# Patient Record
Sex: Male | Born: 1995 | Race: White | Hispanic: Yes | Marital: Single | Smoking: Never smoker
Health system: Southern US, Community
[De-identification: ages and names within clinical notes are randomized; demographics above are authoritative.]

## PROBLEM LIST (undated history)

## (undated) ENCOUNTER — Emergency Department (HOSPITAL_BASED_OUTPATIENT_CLINIC_OR_DEPARTMENT_OTHER): Admission: EM | Payer: Medicaid Other | Source: Home / Self Care

## (undated) ENCOUNTER — Emergency Department (HOSPITAL_COMMUNITY): Payer: No Typology Code available for payment source | Source: Home / Self Care

## (undated) DIAGNOSIS — J302 Other seasonal allergic rhinitis: Secondary | ICD-10-CM

## (undated) HISTORY — PX: TONSILLECTOMY: SUR1361

## (undated) HISTORY — PX: ANKLE SURGERY: SHX546

## (undated) HISTORY — PX: FOOT SURGERY: SHX648

## (undated) HISTORY — PX: ADENOIDECTOMY: SUR15

---
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
## 2017-05-22 ENCOUNTER — Other Ambulatory Visit: Payer: Self-pay

## 2017-05-22 ENCOUNTER — Emergency Department (HOSPITAL_BASED_OUTPATIENT_CLINIC_OR_DEPARTMENT_OTHER): Payer: 59

## 2017-05-22 ENCOUNTER — Emergency Department (HOSPITAL_BASED_OUTPATIENT_CLINIC_OR_DEPARTMENT_OTHER)
Admission: EM | Admit: 2017-05-22 | Discharge: 2017-05-22 | Disposition: A | Discharge status: Home / Self Care | Payer: 59 | Attending: Emergency Medicine | Admitting: Emergency Medicine

## 2017-05-22 ENCOUNTER — Encounter (HOSPITAL_BASED_OUTPATIENT_CLINIC_OR_DEPARTMENT_OTHER): Payer: Self-pay | Admitting: Emergency Medicine

## 2017-05-22 DIAGNOSIS — Y9375 Activity, martial arts: Secondary | ICD-10-CM | POA: Diagnosis not present

## 2017-05-22 DIAGNOSIS — Y999 Unspecified external cause status: Secondary | ICD-10-CM | POA: Diagnosis not present

## 2017-05-22 DIAGNOSIS — S63617A Unspecified sprain of left little finger, initial encounter: Secondary | ICD-10-CM | POA: Insufficient documentation

## 2017-05-22 DIAGNOSIS — X501XXA Overexertion from prolonged static or awkward postures, initial encounter: Secondary | ICD-10-CM

## 2017-05-22 DIAGNOSIS — Z79899 Other long term (current) drug therapy: Secondary | ICD-10-CM | POA: Diagnosis not present

## 2017-05-22 DIAGNOSIS — Y929 Unspecified place or not applicable: Secondary | ICD-10-CM | POA: Insufficient documentation

## 2017-05-22 DIAGNOSIS — W231XXA Caught, crushed, jammed, or pinched between stationary objects, initial encounter: Secondary | ICD-10-CM | POA: Insufficient documentation

## 2017-05-22 DIAGNOSIS — J45909 Unspecified asthma, uncomplicated: Secondary | ICD-10-CM | POA: Diagnosis not present

## 2017-05-22 DIAGNOSIS — Z791 Long term (current) use of non-steroidal anti-inflammatories (NSAID): Secondary | ICD-10-CM | POA: Insufficient documentation

## 2017-05-22 DIAGNOSIS — S6992XA Unspecified injury of left wrist, hand and finger(s), initial encounter: Secondary | ICD-10-CM | POA: Diagnosis present

## 2017-05-22 NOTE — ED Triage Notes (Signed)
L index finger injury on Saturday while doing martial arts. Pain persists.

## 2017-05-22 NOTE — Discharge Instructions (Signed)
Your x-ray shows no signs of fracture.  There is likely a sprain.  Wear the splint for comfort and to decrease the mobility.  Icing the affected area.  Motrin and Tylenol.  Have given you follow-up with a hand doctor if your symptoms are not improving.  Return to the ED V develop any worsening symptoms.

## 2017-05-22 NOTE — ED Notes (Signed)
Pt verbalizes understanding of d/c instructions and denies any further needs at this time. 

## 2017-05-23 NOTE — ED Provider Notes (Signed)
[facility] EMERGENCY DEPARTMENT Provider Note   CSN: [id number] Arrival date & time: 05/22/17  1843     History   Chief Complaint Chief Complaint  Patient presents with  . Finger Injury    HPI Cory Oliver is a 21 y.o. male.  HPI 21 year old presents to the ED with no pertinent past medical history for evaluation of left index finger pain.  Patient states the pain has been ongoing for the past 3 days.  States that he was doing martial arts this past Saturday when he jammed his left index finger.  Patient is tried icing it with some relief.  Has tried no over-the-counter analgesics for the pain.  States the pain persist.  Denies any associated paresthesias, weakness, wound.  Movement makes the pain worse.  Nothing makes the pain better. Past Medical History:  Diagnosis Date  . Asthma   . Seasonal allergies     There are no active problems to display for this patient.   Past Surgical History:  Procedure Laterality Date  . ADENOIDECTOMY    . ANKLE SURGERY    . FOOT SURGERY    . TONSILLECTOMY         Home Medications    Prior to Admission medications   Medication Sig Start Date End Date Taking? Authorizing Provider  albuterol (PROVENTIL HFA;VENTOLIN HFA) 108 (90 BASE) MCG/ACT inhaler Inhale 2 puffs into the lungs every 6 (six) hours as needed. For shortness of breath and wheezing     [provider]  ibuprofen (ADVIL,MOTRIN) 400 MG tablet Take 400 mg by mouth every 6 (six) hours as needed. For pain     [provider]  ibuprofen (ADVIL,MOTRIN) 600 MG tablet Take 1 tablet (600 mg total) by mouth every 6 (six) hours as needed. 08/25/13   Elpidio AnisUpstill, Shari, PA-C  mometasone (NASONEX) 50 MCG/ACT nasal spray Place 1 spray into the nose 2 (two) times daily.      [provider]  naproxen (NAPROSYN) 500 MG tablet Take 1 tablet (500 mg total) by mouth 2 (two) times daily. 11/17/13   Vanetta MuldersZackowski, Scott, MD  naproxen sodium (ANAPROX) 220 MG tablet Take  1 tablet (220 mg total) by mouth 2 (two) times daily with a meal. 06/25/11   Felisa Bonieronnor, Michael D, MD    Family History No family history on file.  Social History Social History   Tobacco Use  . Smoking status: Never Smoker  . Smokeless tobacco: Never Used  Substance Use Topics  . Alcohol use: No  . Drug use: Not on file     Allergies   Patient has no known allergies.   Review of Systems Review of Systems  Musculoskeletal: Positive for arthralgias, joint swelling and myalgias.  Skin: Negative for color change and wound.  Neurological: Negative for weakness and numbness.     Physical Exam Updated Vital Signs BP 126/73 (BP Location: Right Arm)   Pulse 71   Temp 99.3 F (37.4 C) (Oral)   Resp 18   Wt 74.8 kg (165 lb)   SpO2 100%   BMI 25.84 kg/m   Physical Exam  Constitutional: He appears well-developed and well-nourished. No distress.  HENT:  Head: Normocephalic and atraumatic.  Eyes: Right eye exhibits no discharge. Left eye exhibits no discharge. No scleral icterus.  Neck: Normal range of motion.  Pulmonary/Chest: No respiratory distress.  Musculoskeletal: Normal range of motion.  Patient with full range of motion of the left index finger over the DIP PIP and MCP  joint.  Minimal edema noted.  No associated ecchymosis, erythema, warmth of the joints.  Brisk cap refill.  5 out of 5 strength with flexion and extension of the PIP and DIP.  Radial pulses 2+ bilaterally.  Sensation intact.  Neurological: He is alert.  Skin: No pallor.  Psychiatric: His behavior is normal. Judgment and thought content normal.  Nursing note and vitals reviewed.    ED Treatments / Results  Labs (all labs ordered are listed, but only abnormal results are displayed) Labs Reviewed - No data to display  EKG  EKG Interpretation None       Radiology Dg Finger Little Left  Result Date: 05/22/2017 CLINICAL DATA:  Twisting injury of the left little finger. EXAM: LEFT LITTLE FINGER  2+V COMPARISON:  None. FINDINGS: There is no evidence of fracture or dislocation. There is no evidence of arthropathy or other focal bone abnormality. Soft tissues are unremarkable. IMPRESSION: Negative. Electronically Signed   By: Ted Mcalpineobrinka  Dimitrova M.D.   On: 05/22/2017 19:51    Procedures Procedures (including critical care time)  Medications Ordered in ED Medications - No data to display   Initial Impression / Assessment and Plan / ED Course  I have reviewed the triage vital signs and the nursing notes.  Pertinent labs & imaging results that were available during my care of the patient were reviewed by me and considered in my medical decision making (see chart for details).     Patient X-Ray negative for obvious fracture or dislocation.  Patient is neurovascularly intact.  Pain managed in ED. Pt advised to follow up with orthopedics if symptoms persist for possibility of missed fracture diagnosis. Patient given brace while in ED, conservative therapy recommended and discussed. Patient will be dc home & is agreeable with above plan.   Final Clinical Impressions(s) / ED Diagnoses   Final diagnoses:  Sprain of left little finger, unspecified site of finger, initial encounter    ED Discharge Orders    None       Wallace KellerLeaphart, Kenneth T, PA-C 05/23/17 1335    Long, Arlyss RepressJoshua G, MD 05/24/17 [phone]

## 2018-08-01 IMAGING — CR DG FINGER LITTLE 2+V*L*
3 series · 3 of 3 positions shown · non-contrast
Comparison: None.

CLINICAL DATA: Twisting injury of the left little finger.

EXAM:
LEFT LITTLE FINGER 2+V

[x finger pa left]
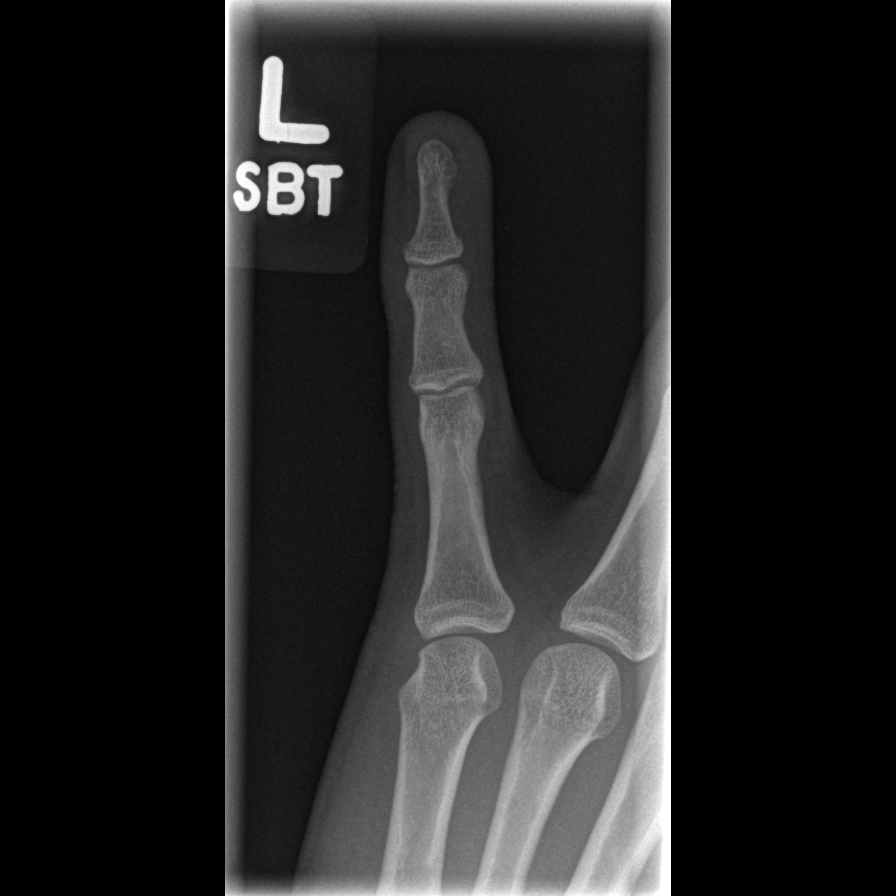

[x finger obl. left]
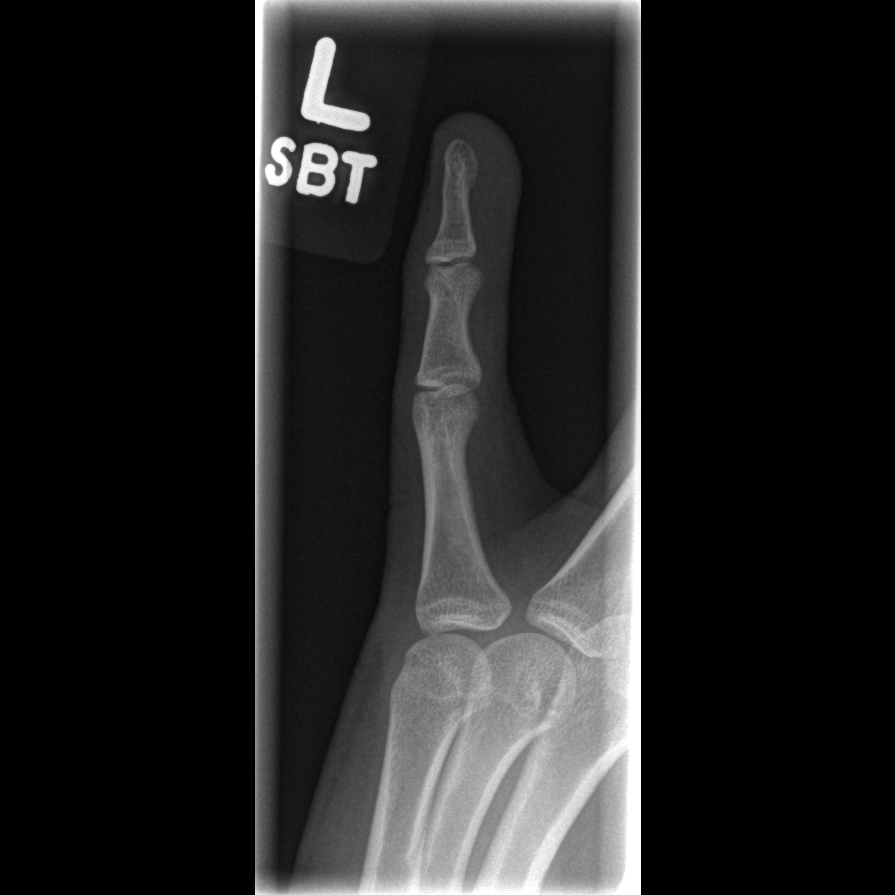

[x finger lateral left]
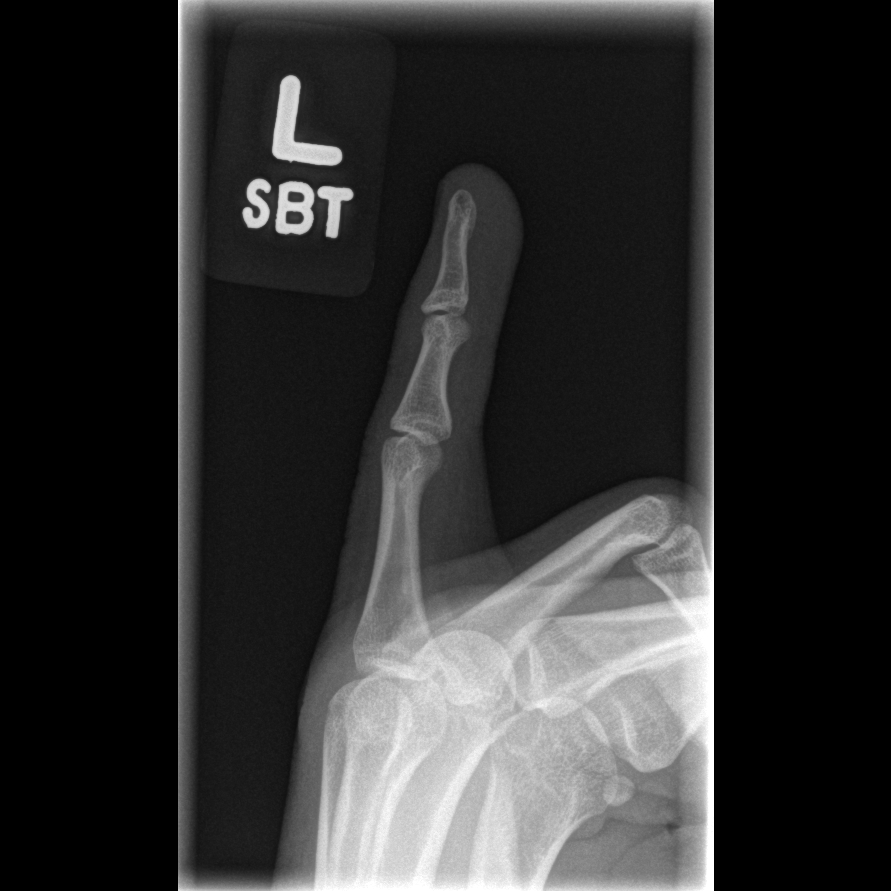

[3 of 3 positions shown; findings below may reference images not displayed]

FINDINGS: There is no evidence of fracture or dislocation. There is no
evidence of arthropathy or other focal bone abnormality. Soft
tissues are unremarkable.
IMPRESSION: Negative.

## 2020-03-17 ENCOUNTER — Encounter (HOSPITAL_COMMUNITY): Payer: Self-pay

## 2020-03-17 ENCOUNTER — Other Ambulatory Visit: Payer: Self-pay

## 2020-03-17 ENCOUNTER — Emergency Department (HOSPITAL_COMMUNITY)
Admission: EM | Admit: 2020-03-17 | Discharge: 2020-03-18 | Disposition: A | Discharge status: Home / Self Care | Payer: HRSA Program | Attending: Emergency Medicine | Admitting: Emergency Medicine

## 2020-03-17 DIAGNOSIS — R109 Unspecified abdominal pain: Secondary | ICD-10-CM | POA: Insufficient documentation

## 2020-03-17 DIAGNOSIS — M7918 Myalgia, other site: Secondary | ICD-10-CM | POA: Insufficient documentation

## 2020-03-17 DIAGNOSIS — Z79899 Other long term (current) drug therapy: Secondary | ICD-10-CM | POA: Insufficient documentation

## 2020-03-17 DIAGNOSIS — U071 COVID-19: Secondary | ICD-10-CM | POA: Insufficient documentation

## 2020-03-17 DIAGNOSIS — J45909 Unspecified asthma, uncomplicated: Secondary | ICD-10-CM | POA: Insufficient documentation

## 2020-03-17 DIAGNOSIS — R509 Fever, unspecified: Secondary | ICD-10-CM | POA: Diagnosis present

## 2020-03-17 LAB — URINALYSIS, ROUTINE W REFLEX MICROSCOPIC
Bilirubin Urine: NEGATIVE
Glucose, UA: NEGATIVE mg/dL
Hgb urine dipstick: NEGATIVE
Ketones, ur: 80 mg/dL — AB
Leukocytes,Ua: NEGATIVE
Nitrite: NEGATIVE
Protein, ur: 100 mg/dL — AB
Specific Gravity, Urine: 1.031 — ABNORMAL HIGH (ref 1.005–1.030)
pH: 5 (ref 5.0–8.0)

## 2020-03-17 LAB — CBC
HCT: 46.3 % (ref 39.0–52.0)
Hemoglobin: 15.5 g/dL (ref 13.0–17.0)
MCH: 30.4 pg (ref 26.0–34.0)
MCHC: 33.5 g/dL (ref 30.0–36.0)
MCV: 90.8 fL (ref 80.0–100.0)
Platelets: 115 10*3/uL — ABNORMAL LOW (ref 150–400)
RBC: 5.1 MIL/uL (ref 4.22–5.81)
RDW: 12.3 % (ref 11.5–15.5)
WBC: 3.9 10*3/uL — ABNORMAL LOW (ref 4.0–10.5)
nRBC: 0 % (ref 0.0–0.2)

## 2020-03-17 LAB — COMPREHENSIVE METABOLIC PANEL
ALT: 157 U/L — ABNORMAL HIGH (ref 0–44)
AST: 128 U/L — ABNORMAL HIGH (ref 15–41)
Albumin: 4.2 g/dL (ref 3.5–5.0)
Alkaline Phosphatase: 61 U/L (ref 38–126)
Anion gap: 13 (ref 5–15)
BUN: 9 mg/dL (ref 6–20)
CO2: 24 mmol/L (ref 22–32)
Calcium: 8.7 mg/dL — ABNORMAL LOW (ref 8.9–10.3)
Chloride: 99 mmol/L (ref 98–111)
Creatinine, Ser: 1.15 mg/dL (ref 0.61–1.24)
GFR calc Af Amer: >60 mL/min (ref >60)
GFR calc non Af Amer: >60 mL/min (ref >60)
Glucose, Bld: 101 mg/dL — ABNORMAL HIGH (ref 70–99)
Potassium: 3.3 mmol/L — ABNORMAL LOW (ref 3.5–5.1)
Sodium: 136 mmol/L (ref 135–145)
Total Bilirubin: 1 mg/dL (ref 0.3–1.2)
Total Protein: 8.1 g/dL (ref 6.5–8.1)

## 2020-03-17 LAB — LIPASE, BLOOD: Lipase: 39 U/L (ref 11–51)

## 2020-03-17 LAB — SARS CORONAVIRUS 2 BY RT PCR (HOSPITAL ORDER, PERFORMED IN ~~LOC~~ HOSPITAL LAB): SARS Coronavirus 2: POSITIVE — AB

## 2020-03-17 MED ORDER — ACETAMINOPHEN 325 MG PO TABS
650.0000 mg | ORAL_TABLET | Freq: Once | ORAL | Status: AC
  Administered 2020-03-17: 650 mg via ORAL

## 2020-03-17 NOTE — ED Triage Notes (Signed)
Pt arrives POV for eval of back pain, fever/chills, SOB, abd pain. Pt reports that he was Covid tested 3x in the last week which were negative. Pt is unvaccinated

## 2020-03-18 ENCOUNTER — Telehealth: Payer: Self-pay | Admitting: Family

## 2020-03-18 MED ORDER — ALBUTEROL SULFATE HFA 108 (90 BASE) MCG/ACT IN AERS
2.0000 | INHALATION_SPRAY | RESPIRATORY_TRACT | Status: DC | PRN
  Administered 2020-03-18: 2 via RESPIRATORY_TRACT
  Filled 2020-03-18: qty 6.7

## 2020-03-18 MED ORDER — DEXAMETHASONE 4 MG PO TABS
10.0000 mg | ORAL_TABLET | Freq: Once | ORAL | Status: AC
  Administered 2020-03-18: 10 mg via ORAL
  Filled 2020-03-18: qty 3

## 2020-03-18 MED ORDER — ONDANSETRON 4 MG PO TBDP: 4.0000 mg | ORAL_TABLET | Freq: Three times a day (TID) | ORAL | 0 refills | Status: AC | PRN

## 2020-03-18 NOTE — ED Provider Notes (Signed)
[facility] EMERGENCY DEPARTMENT Provider Note   CSN: [id number] Arrival date & time: 03/17/20  1858     History Chief Complaint  Patient presents with  . Fever  . Abdominal Pain    Cory Oliver is a 24 y.o. male.  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 He reports traveling to the [place] last week, having a negative COVID test before going and after returning. He has had intermittent symptoms as stated, worsening today. He reports that currently he has no abdominal pain, only mild nausea. No vomiting, no diarrhea. He does not feel he has been wheezing.   The history is provided by the patient. No language interpreter was used.  Fever Associated symptoms: cough, myalgias and sore throat   Associated symptoms: no rash   Abdominal Pain Associated symptoms: cough, fever and sore throat        Past Medical History:  Diagnosis Date  . Asthma   . Seasonal allergies     There are no problems to display for this patient.   Past Surgical History:  Procedure Laterality Date  . ADENOIDECTOMY    . ANKLE SURGERY    . FOOT SURGERY    . TONSILLECTOMY         History reviewed. No pertinent family history.  Social History   Tobacco Use  . Smoking status: Never Smoker  . Smokeless tobacco: Never Used  Substance Use Topics  . Alcohol use: No  . Drug use: Not on file    Home Medications Prior to Admission medications   Medication Sig Start Date End Date Taking? Authorizing Provider  albuterol (PROVENTIL HFA;VENTOLIN HFA) 108 (90 BASE) MCG/ACT inhaler Inhale 2 puffs into the lungs every 6 (six) hours as needed. For shortness of breath and wheezing     [provider]  ibuprofen (ADVIL,MOTRIN) 400 MG tablet Take 400 mg by mouth every 6 (six) hours as needed. For pain     [provider]  ibuprofen (ADVIL,MOTRIN) 600 MG tablet Take 1 tablet (600 mg total) by mouth every 6 (six) hours as needed. 08/25/13   Elpidio Anis, PA-C  mometasone (NASONEX) 50 MCG/ACT nasal spray Place 1 spray into the nose 2 (two) times daily.      [provider]  naproxen (NAPROSYN) 500 MG tablet Take 1 tablet (500 mg total) by mouth 2 (two) times daily. 11/17/13   Vanetta Mulders, MD  naproxen sodium (ANAPROX) 220 MG tablet Take 1 tablet (220 mg total) by mouth 2 (two) times daily with a meal. 06/25/11   Felisa Bonier, MD    Allergies    Patient has no known allergies.  Review of Systems   Review of Systems  Constitutional: Positive for fever.  HENT: Positive for sore throat.   Respiratory: Positive for cough.   Gastrointestinal: Positive for abdominal pain.  Genitourinary: Positive for decreased urine volume.  Musculoskeletal: Positive for myalgias.  Skin: Negative for rash.  Neurological: Negative for weakness.    Physical Exam Updated Vital Signs BP 120/79 (BP Location: Left Arm)   Pulse 85   Temp 100.2 F (37.9 C) (Oral)   Resp 20   Ht 5\' 7"  (1.702 m)   Wt 75 kg   SpO2 99%   BMI 25.90 kg/m   Physical Exam Vitals and nursing note reviewed.  Constitutional:  General: He is not in acute distress.    Appearance: He is well-developed. He is not ill-appearing.  HENT:     Head: Normocephalic.  Cardiovascular:     Rate and Rhythm: Normal rate and regular rhythm.     Heart sounds: No murmur heard.   Pulmonary:     Effort: Pulmonary effort is normal.     Breath sounds: No wheezing, rhonchi or rales.  Abdominal:     General: Abdomen is flat. There is no distension.     Palpations: Abdomen is soft.     Tenderness: There is no abdominal tenderness.  Skin:    General: Skin is warm and dry.  Neurological:     General: No focal deficit present.     Mental Status: He is alert and oriented to person, place, and time.     ED Results / Procedures / Treatments     Labs (all labs ordered are listed, but only abnormal results are displayed) Labs Reviewed  SARS CORONAVIRUS 2 BY RT PCR (HOSPITAL ORDER, PERFORMED IN [facility]) - Abnormal; Notable for the following components:      Result Value   SARS Coronavirus 2 POSITIVE (*)    All other components within normal limits  COMPREHENSIVE METABOLIC PANEL - Abnormal; Notable for the following components:   Potassium 3.3 (*)    Glucose, Bld 101 (*)    Calcium 8.7 (*)    AST 128 (*)    ALT 157 (*)    All other components within normal limits  CBC - Abnormal; Notable for the following components:   WBC 3.9 (*)    Platelets 115 (*)    All other components within normal limits  URINALYSIS, ROUTINE W REFLEX MICROSCOPIC - Abnormal; Notable for the following components:   Color, Urine AMBER (*)    Specific Gravity, Urine 1.031 (*)    Ketones, ur 80 (*)    Protein, ur 100 (*)    Bacteria, UA RARE (*)    All other components within normal limits  LIPASE, BLOOD    EKG None  Radiology No results found.  Procedures Procedures (including critical care time)  Medications Ordered in ED Medications  acetaminophen (TYLENOL) tablet 650 mg (650 mg Oral Given 03/17/20 2143)    ED Course  I have reviewed the triage vital signs and the nursing notes.  Pertinent labs & imaging results that were available during my care of the patient were reviewed by me and considered in my medical decision making (see chart for details).    MDM Rules/Calculators/A&P                          Patient to ED with ss/sxs as per HPI.   He is overall well appearing. No hypoxia, dyspnea, chest pain or current abdominal pain. Abdominal exam is benign. VSS, fever improving.   Remote history of asthma, no need for recent treatment. Will provided inhaler for prn at-home use. Decadron provided in ED. No SOB, hypoxia or tachypnea, so CXR was not felt indicated despite positive COVID testing here.   Will refer to [facility]  infusion clinic for outpatient infusion. Discussed return precautions with the patient, who is felt reliable to return to the ED if symptoms worsen.   Final Clinical Impression(s) / ED Diagnoses Final diagnoses:  None   1. COVID-19 infection  Rx / DC Orders ED Discharge Orders    None  Elpidio Anis, PA-C 03/18/20 0220    Nira Conn, MD 03/18/20 [phone]

## 2020-03-18 NOTE — Telephone Encounter (Signed)
Called to Discuss with patient about Covid symptoms and the use of the monoclonal antibody infusion for those with mild to moderate Covid symptoms and at a high risk of hospitalization.     Pt appears to qualify for this infusion due to co-morbid conditions and/or a member of an at-risk group in accordance with the FDA Emergency Use Authorization.   Cory Oliver was seen at the [facility] ED on 9/1 with nausea, and upper abdominal pain, cough, sore throat and fever that started several days prior to presentation. Risk factors include asthma and BMI >25.   I was unable to reach Cory Oliver and was unable to leave a message because the mailbox was full and he has not activated his MyChart.   [name], NP 03/18/2020 2:43 PM

## 2020-03-18 NOTE — ED Notes (Signed)
Patient verbalizes understanding of discharge instructions. Opportunity for questioning and answers were provided. Armband removed by staff, pt discharged from ED and ambulated to lobby to go home with significant other.   

## 2020-03-18 NOTE — ED Notes (Signed)
Pt moved into + confinement box °

## 2020-03-18 NOTE — Discharge Instructions (Addendum)
Please use your inhaler for any shortness of breath or cough. If this does not resolve your shortness of breath, return to the emergency department for further evaluation.   Zofran for nausea as needed. Push fluids until your urine is light yellow in color indicating you are not dehydrated.   You will be contacted by the Infusion Center to schedule antibody infusion treatment which should help fight the virus.   Return to the emergency department with any new or concerning symptoms.
